# Patient Record
Sex: Male | Born: 1950 | Race: Black or African American | Hispanic: No | Marital: Single | Smoking: Never smoker
Health system: Southern US, Community
[De-identification: ages and names within clinical notes are randomized; demographics above are authoritative.]

## PROBLEM LIST (undated history)

## (undated) HISTORY — PX: EYE SURGERY: SHX253

## (undated) HISTORY — PX: OTHER SURGICAL HISTORY: SHX169

---

## 2013-02-17 ENCOUNTER — Emergency Department (HOSPITAL_COMMUNITY): Payer: Self-pay

## 2013-02-17 ENCOUNTER — Encounter (HOSPITAL_COMMUNITY): Payer: Self-pay | Admitting: Emergency Medicine

## 2013-02-17 ENCOUNTER — Emergency Department (HOSPITAL_COMMUNITY)
Admission: EM | Admit: 2013-02-17 | Discharge: 2013-02-17 | Disposition: A | Discharge status: Home / Self Care | Payer: Self-pay | Attending: Emergency Medicine | Admitting: Emergency Medicine

## 2013-02-17 DIAGNOSIS — S82831A Other fracture of upper and lower end of right fibula, initial encounter for closed fracture: Secondary | ICD-10-CM

## 2013-02-17 DIAGNOSIS — W108XXA Fall (on) (from) other stairs and steps, initial encounter: Secondary | ICD-10-CM | POA: Insufficient documentation

## 2013-02-17 DIAGNOSIS — S82899A Other fracture of unspecified lower leg, initial encounter for closed fracture: Secondary | ICD-10-CM | POA: Insufficient documentation

## 2013-02-17 DIAGNOSIS — Y92009 Unspecified place in unspecified non-institutional (private) residence as the place of occurrence of the external cause: Secondary | ICD-10-CM | POA: Insufficient documentation

## 2013-02-17 DIAGNOSIS — X500XXA Overexertion from strenuous movement or load, initial encounter: Secondary | ICD-10-CM | POA: Insufficient documentation

## 2013-02-17 DIAGNOSIS — Y9301 Activity, walking, marching and hiking: Secondary | ICD-10-CM | POA: Insufficient documentation

## 2013-02-17 MED ORDER — HYDROCODONE-ACETAMINOPHEN 5-325 MG PO TABS: 2.0000 | ORAL_TABLET | ORAL | Status: DC | PRN

## 2013-02-17 NOTE — Discharge Instructions (Signed)
Wear CAM walker as directed. You may remove this to sleep and shower. Use crutches and do not bear weight on your affected ankle. Take Vicodin as needed for pain. Follow up with Dr. Rennis ChrisSupple for further evaluation. Refer to attached documents for more information.

## 2013-02-17 NOTE — ED Notes (Signed)
Per PTAR: Pt was walking down stairs at his home and slipped down some stairs outside, which had ice on them. Pt reports right ankle pain and PTAR reports that the ankle has moderate swelling. Pt is A/O x4, denies LOC, or other injury.

## 2013-02-17 NOTE — ED Provider Notes (Signed)
CSN: [id number]     Arrival date & time 02/17/13  1109 History   First MD Initiated Contact with Patient 02/17/13 1114     Chief Complaint  Patient presents with  . Fall  . Ankle Injury   (Consider location/radiation/quality/duration/timing/severity/associated sxs/prior Treatment) HPI Comments: Patient is a 63 year old male who presents with right ankle pain that occurred prior to arrival when he slipped on icy steps and "twisted" his ankle. The mechanism of injury was sudden ankle inversion. Patient reports hearing a "pop" sudden onset of throbbing, moderate pain that is localized to right ankle. Patient reports progressive worsening of pain. Ankle movement and weight bearing activity make the pain worse. Nothing makes the pain better. Patient reports associated swelling. Patient has not tried anything for pain relief. Patient denies obvious deformity, numbness/tingling, coolness/weakness of extremity, bruising, and any other injury.      History reviewed. No pertinent past medical history. Past Surgical History  Procedure Laterality Date  . Eye surgery     No family history on file. History  Substance Use Topics  . Smoking status: Never Smoker   . Smokeless tobacco: Never Used  . Alcohol Use: Yes     Comment: Socially     Review of Systems  Constitutional: Negative for fever, chills and fatigue.  HENT: Negative for trouble swallowing.   Eyes: Negative for visual disturbance.  Respiratory: Negative for shortness of breath.   Cardiovascular: Negative for chest pain and palpitations.  Gastrointestinal: Negative for nausea, vomiting, abdominal pain and diarrhea.  Genitourinary: Negative for dysuria and difficulty urinating.  Musculoskeletal: Positive for arthralgias and joint swelling. Negative for neck pain.  Skin: Negative for color change.  Neurological: Negative for dizziness and weakness.  Psychiatric/Behavioral: Negative for dysphoric mood.    Allergies  Review of  patient's allergies indicates no known allergies.  Home Medications  No current outpatient prescriptions on file. BP 141/82  Pulse 61  Temp(Src) 99 F (37.2 C) (Oral)  Resp 20  SpO2 97% Physical Exam  Nursing note and vitals reviewed. Constitutional: He is oriented to person, place, and time. He appears well-developed and well-nourished. No distress.  HENT:  Head: Normocephalic and atraumatic.  Eyes: Conjunctivae are normal.  Neck: Normal range of motion.  Cardiovascular: Normal rate and regular rhythm.  Exam reveals no gallop and no friction rub.   No murmur heard. Pulmonary/Chest: Effort normal and breath sounds normal. He has no wheezes. He has no rales. He exhibits no tenderness.  Musculoskeletal:  Right ankle generalized edema and lateral tenderness to palpation. Limited ROM due to pain and edema. No obvious deformity.   Neurological: He is alert and oriented to person, place, and time. Coordination normal.  Sensation intact distal to injury. Speech is goal-oriented.   Skin: Skin is warm and dry.  Psychiatric: He has a normal mood and affect. His behavior is normal.    ED Course  Procedures (including critical care time) Labs Review Labs Reviewed - No data to display Imaging Review Dg Ankle Complete Right  02/17/2013   CLINICAL DATA:  Fall.  EXAM: RIGHT ANKLE - COMPLETE 3+ VIEW  COMPARISON:  None.  FINDINGS: Diffuse soft tissue swelling. An oblique displaced fracture is noted of the distal right fibular shaft. Subtle avulsion fracture fragments noted from the medial malleolus. Widening of the tibiotalar joint space present.  IMPRESSION: 1. Oblique displaced fracture noted of the distal right fibular shaft. 2. Subtle tiny avulsion fractures noted from the medial malleolus. Widening of the tibiotalar  joint space.   Electronically Signed   By: Maisie Fus  Register   On: 02/17/2013 11:47    EKG Interpretation   None       MDM   1. Fracture of fibula, distal, right, closed      12:08 PM Xray shows distal right fibula fracture. No neurovascular compromise. Patient will have CAM walker, crutches and recommended Orthopedic follow up. Patient will be non weight bearing. Vitals stable and patient afebrile. Patient declines pain medication at this time. Patient denies any other injury.     Emilia Beck, PA-C 02/17/13 1244

## 2013-02-17 NOTE — ED Provider Notes (Signed)
Medical screening examination/treatment/procedure(s) were conducted as a shared visit with non-physician practitioner(s) and myself.  I personally evaluated the patient during the encounter.  EKG Interpretation   None       nonweight-bearing right lower extremity.  Crutches and Cam Walker.  Given ankle fracture patient will likely need operative repair.  Orthopedic followup.  Lyanne CoKevin M Braxdon Gappa, MD 02/17/13 [phone]

## 2013-02-24 ENCOUNTER — Encounter (HOSPITAL_COMMUNITY): Payer: Self-pay | Admitting: Pharmacy Technician

## 2013-02-26 ENCOUNTER — Encounter (HOSPITAL_COMMUNITY): Payer: Self-pay | Admitting: *Deleted

## 2013-02-28 MED ORDER — CEFAZOLIN SODIUM-DEXTROSE 2-3 GM-% IV SOLR
2.0000 g | INTRAVENOUS | Status: AC
  Administered 2013-03-01: 2 g via INTRAVENOUS
  Filled 2013-02-28: qty 50

## 2013-02-28 MED ORDER — CHLORHEXIDINE GLUCONATE 4 % EX LIQD: 60.0000 mL | Freq: Once | CUTANEOUS | Status: DC

## 2013-02-28 MED ORDER — LACTATED RINGERS IV SOLN
INTRAVENOUS | Status: DC
  Administered 2013-03-01: 13:00:00 via INTRAVENOUS

## 2013-03-01 ENCOUNTER — Ambulatory Visit (HOSPITAL_COMMUNITY): Payer: Self-pay

## 2013-03-01 ENCOUNTER — Ambulatory Visit (HOSPITAL_COMMUNITY): Payer: Self-pay | Admitting: Anesthesiology

## 2013-03-01 ENCOUNTER — Ambulatory Visit (HOSPITAL_COMMUNITY)
Admission: RE | Admit: 2013-03-01 | Discharge: 2013-03-01 | Disposition: A | Discharge status: Home / Self Care | Payer: Self-pay | Source: Ambulatory Visit | Attending: Orthopedic Surgery | Admitting: Orthopedic Surgery

## 2013-03-01 ENCOUNTER — Encounter (HOSPITAL_COMMUNITY): Payer: Self-pay | Admitting: Anesthesiology

## 2013-03-01 ENCOUNTER — Encounter (HOSPITAL_COMMUNITY): Payer: Self-pay | Admitting: *Deleted

## 2013-03-01 ENCOUNTER — Encounter (HOSPITAL_COMMUNITY)
Admission: RE | Disposition: A | Discharge status: Home / Self Care | Payer: Self-pay | Source: Ambulatory Visit | Attending: Orthopedic Surgery

## 2013-03-01 DIAGNOSIS — X58XXXA Exposure to other specified factors, initial encounter: Secondary | ICD-10-CM | POA: Insufficient documentation

## 2013-03-01 DIAGNOSIS — S82899A Other fracture of unspecified lower leg, initial encounter for closed fracture: Secondary | ICD-10-CM | POA: Insufficient documentation

## 2013-03-01 HISTORY — PX: ORIF ANKLE FRACTURE: SHX5408

## 2013-03-01 LAB — COMPREHENSIVE METABOLIC PANEL
ALBUMIN: 3.5 g/dL (ref 3.5–5.2)
ALK PHOS: 69 U/L (ref 39–117)
ALT: 23 U/L (ref 0–53)
AST: 26 U/L (ref 0–37)
BUN: 11 mg/dL (ref 6–23)
CO2: 25 mEq/L (ref 19–32)
CREATININE: 1.11 mg/dL (ref 0.50–1.35)
Calcium: 9.1 mg/dL (ref 8.4–10.5)
Chloride: 102 mEq/L (ref 96–112)
GFR calc Af Amer: 80 mL/min — ABNORMAL LOW (ref >90)
GFR calc non Af Amer: 69 mL/min — ABNORMAL LOW (ref >90)
Glucose, Bld: 102 mg/dL — ABNORMAL HIGH (ref 70–99)
POTASSIUM: 4.1 meq/L (ref 3.7–5.3)
Sodium: 139 mEq/L (ref 137–147)
TOTAL PROTEIN: 7.1 g/dL (ref 6.0–8.3)
Total Bilirubin: 0.9 mg/dL (ref 0.3–1.2)

## 2013-03-01 LAB — CBC WITH DIFFERENTIAL/PLATELET
BASOS ABS: 0 10*3/uL (ref 0.0–0.1)
BASOS PCT: 0 % (ref 0–1)
EOS PCT: 1 % (ref 0–5)
Eosinophils Absolute: 0 10*3/uL (ref 0.0–0.7)
HCT: 41.4 % (ref 39.0–52.0)
Hemoglobin: 14.2 g/dL (ref 13.0–17.0)
Lymphocytes Relative: 39 % (ref 12–46)
Lymphs Abs: 1.7 10*3/uL (ref 0.7–4.0)
MCH: 29.7 pg (ref 26.0–34.0)
MCHC: 34.3 g/dL (ref 30.0–36.0)
MCV: 86.6 fL (ref 78.0–100.0)
Monocytes Absolute: 0.5 10*3/uL (ref 0.1–1.0)
Monocytes Relative: 12 % (ref 3–12)
Neutro Abs: 2.1 10*3/uL (ref 1.7–7.7)
Neutrophils Relative %: 48 % (ref 43–77)
PLATELETS: 268 10*3/uL (ref 150–400)
RBC: 4.78 MIL/uL (ref 4.22–5.81)
RDW: 13.5 % (ref 11.5–15.5)
WBC: 4.3 10*3/uL (ref 4.0–10.5)

## 2013-03-01 LAB — APTT: aPTT: 36 seconds (ref 24–37)

## 2013-03-01 LAB — PROTIME-INR
INR: 0.98 (ref 0.00–1.49)
Prothrombin Time: 12.8 seconds (ref 11.6–15.2)

## 2013-03-01 SURGERY — OPEN REDUCTION INTERNAL FIXATION (ORIF) ANKLE FRACTURE
Anesthesia: General | Site: Ankle | Laterality: Right

## 2013-03-01 MED ORDER — FENTANYL CITRATE 0.05 MG/ML IJ SOLN
INTRAMUSCULAR | Status: AC
  Filled 2013-03-01: qty 5

## 2013-03-01 MED ORDER — HYDROMORPHONE HCL PF 1 MG/ML IJ SOLN: 0.2500 mg | INTRAMUSCULAR | Status: DC | PRN

## 2013-03-01 MED ORDER — DEXAMETHASONE SODIUM PHOSPHATE 4 MG/ML IJ SOLN
INTRAMUSCULAR | Status: DC | PRN
  Administered 2013-03-01: 4 mg via INTRAVENOUS

## 2013-03-01 MED ORDER — LACTATED RINGERS IV SOLN
INTRAVENOUS | Status: DC | PRN
  Administered 2013-03-01 (×2): via INTRAVENOUS

## 2013-03-01 MED ORDER — FENTANYL CITRATE 0.05 MG/ML IJ SOLN
50.0000 ug | INTRAMUSCULAR | Status: DC | PRN
  Administered 2013-03-01: 100 ug via INTRAVENOUS

## 2013-03-01 MED ORDER — KETOROLAC TROMETHAMINE 30 MG/ML IJ SOLN: 15.0000 mg | Freq: Once | INTRAMUSCULAR | Status: DC | PRN

## 2013-03-01 MED ORDER — LIDOCAINE HCL (CARDIAC) 20 MG/ML IV SOLN
INTRAVENOUS | Status: AC
  Filled 2013-03-01: qty 5

## 2013-03-01 MED ORDER — MIDAZOLAM HCL 2 MG/2ML IJ SOLN
1.0000 mg | INTRAMUSCULAR | Status: DC | PRN
  Administered 2013-03-01: 2 mg via INTRAVENOUS

## 2013-03-01 MED ORDER — ONDANSETRON HCL 4 MG/2ML IJ SOLN
INTRAMUSCULAR | Status: DC | PRN
  Administered 2013-03-01: 4 mg via INTRAVENOUS

## 2013-03-01 MED ORDER — MIDAZOLAM HCL 2 MG/2ML IJ SOLN
INTRAMUSCULAR | Status: AC
  Filled 2013-03-01: qty 2

## 2013-03-01 MED ORDER — ARTIFICIAL TEARS OP OINT
TOPICAL_OINTMENT | OPHTHALMIC | Status: AC
  Filled 2013-03-01: qty 3.5

## 2013-03-01 MED ORDER — DEXAMETHASONE SODIUM PHOSPHATE 4 MG/ML IJ SOLN
INTRAMUSCULAR | Status: AC
  Filled 2013-03-01: qty 1

## 2013-03-01 MED ORDER — ONDANSETRON HCL 4 MG/2ML IJ SOLN: 4.0000 mg | Freq: Once | INTRAMUSCULAR | Status: DC | PRN

## 2013-03-01 MED ORDER — MIDAZOLAM HCL 5 MG/5ML IJ SOLN
INTRAMUSCULAR | Status: DC | PRN
  Administered 2013-03-01: 2 mg via INTRAVENOUS

## 2013-03-01 MED ORDER — FENTANYL CITRATE 0.05 MG/ML IJ SOLN
INTRAMUSCULAR | Status: AC
  Filled 2013-03-01: qty 2

## 2013-03-01 MED ORDER — ONDANSETRON HCL 4 MG/2ML IJ SOLN
INTRAMUSCULAR | Status: AC
  Filled 2013-03-01: qty 2

## 2013-03-01 MED ORDER — PROPOFOL 10 MG/ML IV BOLUS
INTRAVENOUS | Status: DC | PRN
  Administered 2013-03-01: 170 mg via INTRAVENOUS

## 2013-03-01 MED ORDER — DIAZEPAM 5 MG PO TABS: 2.5000 mg | ORAL_TABLET | Freq: Four times a day (QID) | ORAL | Status: AC | PRN

## 2013-03-01 MED ORDER — DOUBLE ANTIBIOTIC 500-10000 UNIT/GM EX OINT
TOPICAL_OINTMENT | CUTANEOUS | Status: AC
  Filled 2013-03-01: qty 1

## 2013-03-01 MED ORDER — LIDOCAINE HCL (CARDIAC) 10 MG/ML IV SOLN
INTRAVENOUS | Status: DC | PRN
  Administered 2013-03-01: 100 mg via INTRAVENOUS

## 2013-03-01 MED ORDER — OXYCODONE-ACETAMINOPHEN 5-325 MG PO TABS: 1.0000 | ORAL_TABLET | ORAL | Status: AC | PRN

## 2013-03-01 MED ORDER — 0.9 % SODIUM CHLORIDE (POUR BTL) OPTIME
TOPICAL | Status: DC | PRN
  Administered 2013-03-01: 1000 mL

## 2013-03-01 MED ORDER — FENTANYL CITRATE 0.05 MG/ML IJ SOLN
INTRAMUSCULAR | Status: DC | PRN
  Administered 2013-03-01: 50 ug via INTRAVENOUS

## 2013-03-01 MED ORDER — PROPOFOL 10 MG/ML IV BOLUS
INTRAVENOUS | Status: AC
  Filled 2013-03-01: qty 20

## 2013-03-01 SURGICAL SUPPLY — 77 items
BANDAGE ELASTIC 4 VELCRO ST LF (GAUZE/BANDAGES/DRESSINGS) ×2 IMPLANT
BANDAGE ELASTIC 6 VELCRO ST LF (GAUZE/BANDAGES/DRESSINGS) ×2 IMPLANT
BANDAGE ESMARK 6X9 LF (GAUZE/BANDAGES/DRESSINGS) ×1 IMPLANT
BIT DRILL 2.5X110 QC LCP DISP (BIT) ×2 IMPLANT
BIT DRILL PERC QC 2.8X200 100 (BIT) ×1 IMPLANT
BIT DRILL QC 3.5X110 (BIT) ×2 IMPLANT
BLADE SURG 10 STRL SS (BLADE) ×2 IMPLANT
BLADE SURG ROTATE 9660 (MISCELLANEOUS) IMPLANT
BNDG ESMARK 6X9 LF (GAUZE/BANDAGES/DRESSINGS) ×2
CLOTH BEACON ORANGE TIMEOUT ST (SAFETY) ×2 IMPLANT
CLSR STERI-STRIP ANTIMIC 1/2X4 (GAUZE/BANDAGES/DRESSINGS) ×2 IMPLANT
COVER MAYO STAND STRL (DRAPES) IMPLANT
COVER SURGICAL LIGHT HANDLE (MISCELLANEOUS) ×2 IMPLANT
CUFF TOURNIQUET SINGLE 18IN (TOURNIQUET CUFF) IMPLANT
CUFF TOURNIQUET SINGLE 24IN (TOURNIQUET CUFF) IMPLANT
CUFF TOURNIQUET SINGLE 34IN LL (TOURNIQUET CUFF) ×2 IMPLANT
CUFF TOURNIQUET SINGLE 44IN (TOURNIQUET CUFF) IMPLANT
DRAPE C-ARM 42X72 X-RAY (DRAPES) ×2 IMPLANT
DRAPE INCISE IOBAN 66X45 STRL (DRAPES) IMPLANT
DRAPE LAPAROTOMY T 102X78X121 (DRAPES) IMPLANT
DRAPE OEC MINIVIEW 54X84 (DRAPES) IMPLANT
DRAPE PROXIMA HALF (DRAPES) IMPLANT
DRAPE SURG 17X23 STRL (DRAPES) IMPLANT
DRAPE U-SHAPE 47X51 STRL (DRAPES) ×2 IMPLANT
DRILL BIT QUICK COUP 2.8MM 100 (BIT) ×1
DRSG ADAPTIC 3X8 NADH LF (GAUZE/BANDAGES/DRESSINGS) ×2 IMPLANT
DRSG PAD ABDOMINAL 8X10 ST (GAUZE/BANDAGES/DRESSINGS) IMPLANT
DURAPREP 26ML APPLICATOR (WOUND CARE) ×2 IMPLANT
ELECT REM PT RETURN 9FT ADLT (ELECTROSURGICAL) ×2
ELECTRODE REM PT RTRN 9FT ADLT (ELECTROSURGICAL) ×1 IMPLANT
FACESHIELD LNG OPTICON STERILE (SAFETY) IMPLANT
GLOVE BIO SURGEON STRL SZ7.5 (GLOVE) ×2 IMPLANT
GLOVE BIO SURGEON STRL SZ8 (GLOVE) ×2 IMPLANT
GLOVE EUDERMIC 7 POWDERFREE (GLOVE) ×2 IMPLANT
GLOVE SS BIOGEL STRL SZ 7.5 (GLOVE) ×1 IMPLANT
GLOVE SUPERSENSE BIOGEL SZ 7.5 (GLOVE) ×1
GOWN STRL NON-REIN LRG LVL3 (GOWN DISPOSABLE) ×2 IMPLANT
GOWN STRL REIN XL XLG (GOWN DISPOSABLE) ×4 IMPLANT
KIT BASIN OR (CUSTOM PROCEDURE TRAY) ×2 IMPLANT
KIT ROOM TURNOVER OR (KITS) ×2 IMPLANT
MANIFOLD NEPTUNE II (INSTRUMENTS) IMPLANT
NEEDLE 22X1 1/2 (OR ONLY) (NEEDLE) IMPLANT
NS IRRIG 1000ML POUR BTL (IV SOLUTION) ×2 IMPLANT
PACK ORTHO EXTREMITY (CUSTOM PROCEDURE TRAY) ×2 IMPLANT
PAD ABD 8X10 STRL (GAUZE/BANDAGES/DRESSINGS) ×6 IMPLANT
PAD ARMBOARD 7.5X6 YLW CONV (MISCELLANEOUS) ×2 IMPLANT
PAD CAST 4YDX4 CTTN HI CHSV (CAST SUPPLIES) ×1 IMPLANT
PADDING CAST COTTON 4X4 STRL (CAST SUPPLIES) ×1
PLATE LCP 3.5 1/3 TUB 8HX93 (Plate) ×2 IMPLANT
SCREW CANC FT ST SFS 4X16 (Screw) ×2 IMPLANT
SCREW CORTEX 3.5 10MM (Screw) ×1 IMPLANT
SCREW CORTEX 3.5 14MM (Screw) ×2 IMPLANT
SCREW CORTEX 3.5 16MM (Screw) ×2 IMPLANT
SCREW CORTEX 3.5 18MM (Screw) ×1 IMPLANT
SCREW LOCK CORT ST 3.5X10 (Screw) ×1 IMPLANT
SCREW LOCK CORT ST 3.5X14 (Screw) ×2 IMPLANT
SCREW LOCK CORT ST 3.5X16 (Screw) ×2 IMPLANT
SCREW LOCK CORT ST 3.5X18 (Screw) ×1 IMPLANT
SCREW LOCK T15 FT 14X3.5X2.9X (Screw) ×1 IMPLANT
SCREW LOCKING 3.5X14 (Screw) ×1 IMPLANT
SPLINT PLASTER CAST XFAST 4X15 (CAST SUPPLIES) ×1 IMPLANT
SPLINT PLASTER XTRA FAST SET 4 (CAST SUPPLIES) ×1
SPONGE GAUZE 4X4 12PLY (GAUZE/BANDAGES/DRESSINGS) ×2 IMPLANT
SPONGE LAP 4X18 X RAY DECT (DISPOSABLE) ×2 IMPLANT
STAPLER VISISTAT 35W (STAPLE) IMPLANT
STRIP CLOSURE SKIN 1/2X4 (GAUZE/BANDAGES/DRESSINGS) ×2 IMPLANT
SUCTION FRAZIER TIP 10 FR DISP (SUCTIONS) ×2 IMPLANT
SUT MNCRL AB 3-0 PS2 18 (SUTURE) ×4 IMPLANT
SUT VIC AB 0 CT1 27 (SUTURE) ×1
SUT VIC AB 0 CT1 27XBRD ANBCTR (SUTURE) ×1 IMPLANT
SUT VIC AB 2-0 CT1 27 (SUTURE) ×3
SUT VIC AB 2-0 CT1 TAPERPNT 27 (SUTURE) ×3 IMPLANT
SYR CONTROL 10ML LL (SYRINGE) IMPLANT
TOWEL OR 17X24 6PK STRL BLUE (TOWEL DISPOSABLE) ×2 IMPLANT
TOWEL OR 17X26 10 PK STRL BLUE (TOWEL DISPOSABLE) ×2 IMPLANT
TUBE CONNECTING 12X1/4 (SUCTIONS) ×2 IMPLANT
WATER STERILE IRR 1000ML POUR (IV SOLUTION) IMPLANT

## 2013-03-01 NOTE — Preoperative (Signed)
Beta Blockers   Reason not to administer Beta Blockers:Not Applicable 

## 2013-03-01 NOTE — Progress Notes (Signed)
CRNA at bedside.

## 2013-03-01 NOTE — H&P (Signed)
Julio Almerry Ostergaard    Chief Complaint: RIGHT DISTAL FIBULAR FRACTURE HPI: The patient is a 63 y.o. male with a displaced right distal fibular fracture  History reviewed. No pertinent past medical history.  Past Surgical History  Procedure Laterality Date  . Thumb surgery      as a child  . Eye surgery      as a child    History reviewed. No pertinent family history.  Social History:  reports that he has never smoked. He has never used smokeless tobacco. He reports that he drinks alcohol. He reports that he does not use illicit drugs.  Allergies: No Known Allergies  No prescriptions prior to admission     Physical Exam: right ankle with swelling and lateral tenderness, skin intact  Vitals  Temp:  [98.3 F (36.8 C)] 98.3 F (36.8 C) (01/26 1257) Pulse Rate:  [60-82] 77 (01/26 1415) Resp:  [8-26] 14 (01/26 1415) BP: (115-150)/(54-93) 120/54 mmHg (01/26 1415) SpO2:  [98 %-100 %] 100 % (01/26 1415) Weight:  [88.451 kg (195 lb)] 88.451 kg (195 lb) (01/26 1257)  Assessment/Plan  Impression: RIGHT DISTAL FIBULAR FRACTURE  Plan of Action: Procedure(s): OPEN REDUCTION INTERNAL FIXATION (ORIF) RIGHT  ANKLE FRACTURE  Roniqua Kintz M 03/01/2013, 2:45 PM

## 2013-03-01 NOTE — Op Note (Signed)
03/01/2013  3:48 PM  PATIENT:   Shane Chang  63 y.o. male  PRE-OPERATIVE DIAGNOSIS:  RIGHT DISTAL FIBULAR FRACTURE  POST-OPERATIVE DIAGNOSIS:  same  PROCEDURE:  ORIF  SURGEON:  Nayra Coury, Vania ReaKevin M. M.D.  ASSISTANTS: Shuford pac   ANESTHESIA:   GET + nerve block  EBL: min  SPECIMEN:  none  Drains: none  TT <1hr   PATIENT DISPOSITION:  PACU - hemodynamically stable.    PLAN OF CARE: Discharge to home after PACU  Dictation# [id number]

## 2013-03-01 NOTE — Anesthesia Procedure Notes (Addendum)
Anesthesia Regional Block:   Narrative:    Anesthesia Regional Block:  Popliteal block  Pre-Anesthetic Checklist: ,, timeout performed, Correct Patient, Correct Site, Correct Laterality, Correct Procedure, Correct Position, site marked, Risks and benefits discussed,  Surgical consent,  Pre-op evaluation,  At surgeon's request and post-op pain management  Laterality: Right  Prep: chloraprep       Needles:  Injection technique: Single-shot  Needle Type: Echogenic Stimulator Needle     Needle Length:cm 9 cm Needle Gauge: 22 and 22 G    Additional Needles:  Procedures: ultrasound guided (picture in chart) Popliteal block Narrative:  Start time: 03/01/2013 2:10 PM End time: 03/01/2013 2:15 PM Injection made incrementally with aspirations every 5 mL.  Performed by: Personally   Additional Notes: 30 cc 0.5% Bupivacaine with 1:200 Epi and 8 mg decadron

## 2013-03-01 NOTE — Transfer of Care (Signed)
Immediate Anesthesia Transfer of Care Note  Patient: Shane Chang  Procedure(s) Performed: Procedure(s): OPEN REDUCTION INTERNAL FIXATION (ORIF) RIGHT  ANKLE FRACTURE (Right)  Patient Location: PACU  Anesthesia Type:General  Level of Consciousness: awake, alert , oriented and patient cooperative  Airway & Oxygen Therapy: Patient Spontanous Breathing and Patient connected to nasal cannula oxygen  Post-op Assessment: Report given to PACU RN, Post -op Vital signs reviewed and stable and Patient moving all extremities X 4  Post vital signs: Reviewed and stable  Complications: No apparent anesthesia complications

## 2013-03-01 NOTE — Discharge Instructions (Signed)
Keep current splint clean and dry. Use a bag for showering. No weight on right leg, crutches for ambulation. Bring your CAM walker (boot) to your first post operative appointment with you   What to eat:  For your first meals, you should eat lightly; only small meals initially.  If you do not have nausea, you may eat larger meals.  Avoid spicy, greasy and heavy food.    General Anesthesia, Adult, Care After  Refer to this sheet in the next few weeks. These instructions provide you with information on caring for yourself after your procedure. Your health care provider may also give you more specific instructions. Your treatment has been planned according to current medical practices, but problems sometimes occur. Call your health care provider if you have any problems or questions after your procedure.  WHAT TO EXPECT AFTER THE PROCEDURE  After the procedure, it is typical to experience:  Sleepiness.  Nausea and vomiting. HOME CARE INSTRUCTIONS  For the first 24 hours after general anesthesia:  Have a responsible person with you.  Do not drive a car. If you are alone, do not take public transportation.  Do not drink alcohol.  Do not take medicine that has not been prescribed by your health care provider.  Do not sign important papers or make important decisions.  You may resume a normal diet and activities as directed by your health care provider.  Change bandages (dressings) as directed.  If you have questions or problems that seem related to general anesthesia, call the hospital and ask for the anesthetist or anesthesiologist on call. SEEK MEDICAL CARE IF:  You have nausea and vomiting that continue the day after anesthesia.  You develop a rash. SEEK IMMEDIATE MEDICAL CARE IF:  You have difficulty breathing.  You have chest pain.  You have any allergic problems. Document Released: 04/29/2000 Document Revised: 09/23/2012 Document Reviewed: 08/06/2012  [facility] Patient Information  2014 [employer], [place].

## 2013-03-01 NOTE — Anesthesia Postprocedure Evaluation (Signed)
  Anesthesia Post-op Note  Patient: Shane Chang  Procedure(s) Performed: Procedure(s): OPEN REDUCTION INTERNAL FIXATION (ORIF) RIGHT  ANKLE FRACTURE (Right)  Patient Location: PACU  Anesthesia Type:GA combined with regional for post-op pain  Level of Consciousness: awake, alert  and oriented  Airway and Oxygen Therapy: Patient Spontanous Breathing and Patient connected to nasal cannula oxygen  Post-op Pain: None  Post-op Assessment: Post-op Vital signs reviewed, Patient's Cardiovascular Status Stable, Respiratory Function Stable, Patent Airway, No signs of Nausea or vomiting and Pain level controlled  Post-op Vital Signs: stable  Complications: No apparent anesthesia complications

## 2013-03-01 NOTE — Addendum Note (Signed)
Addendum created 03/01/13 1940 by Kipp Broodavid Saqib Cazarez, MD   Modules edited: Anesthesia Blocks and Procedures

## 2013-03-01 NOTE — Anesthesia Preprocedure Evaluation (Signed)
Anesthesia Evaluation  Patient identified by MRN, date of birth, ID band Patient awake    Reviewed: Allergy & Precautions, H&P , NPO status , Patient's Chart, lab work & pertinent test results  Airway Mallampati: II TM Distance: >3 FB Neck ROM: Full    Dental  (+) Teeth Intact and Dental Advisory Given   Pulmonary  breath sounds clear to auscultation        Cardiovascular Rhythm:Regular Rate:Normal     Neuro/Psych    GI/Hepatic   Endo/Other    Renal/GU      Musculoskeletal   Abdominal   Peds  Hematology   Anesthesia Other Findings   Reproductive/Obstetrics                           Anesthesia Physical Anesthesia Plan  ASA: I  Anesthesia Plan: General   Post-op Pain Management:    Induction: Intravenous  Airway Management Planned: LMA  Additional Equipment:   Intra-op Plan:   Post-operative Plan:   Informed Consent: I have reviewed the patients History and Physical, chart, labs and discussed the procedure including the risks, benefits and alternatives for the proposed anesthesia with the patient or authorized representative who has indicated his/her understanding and acceptance.   Dental advisory given  Plan Discussed with: CRNA and Anesthesiologist  Anesthesia Plan Comments: (R. Ankle Fracture  Plan GA with popliteal block  )        Anesthesia Quick Evaluation

## 2013-03-02 ENCOUNTER — Encounter (HOSPITAL_COMMUNITY): Payer: Self-pay | Admitting: Orthopedic Surgery

## 2013-03-03 NOTE — Op Note (Signed)
NAMRacheal Patches:  Chang, Shane Chang                 ACCOUNT NO.:  [id number]  MEDICAL RECORD NO.:  [id number]  LOCATION:                               FACILITY:  [facility]  PHYSICIAN:  Vania ReaKevin M. Devyn Sheerin, M.D.  DATE OF BIRTH:  12-16-1950  DATE OF PROCEDURE:  03/01/2013 DATE OF DISCHARGE:  03/01/2013                              OPERATIVE REPORT   PREOPERATIVE DIAGNOSIS:  Displaced right distal fibular fracture.  POSTOPERATIVE DIAGNOSIS:  Displaced right distal fibular fracture.  PROCEDURE:  Open reduction and internal fixation of the displaced right distal fibular fracture.  SURGEON:  Vania ReaKevin M. Desiree Fleming, M.D.  Threasa HeadsASSISTANFrench Ana:  Tracy A. Shuford, PA-C.  ANESTHESIA:  General endotracheal as well as a peroneal nerve block.  TOURNIQUET TIME:  Less than 1 hour.  ESTIMATED BLOOD LOSS:  Minimal.  DRAINS:  None.  HISTORY:  Shane Chang is a 63 year old gentleman who fell sustaining a displaced right distal fibular fracture almost 2 weeks ago.  He was seen in the local ER, with instructions to follow up in our office.  At that time in our office, he was found to have diffuse swelling about the ankle tenderness laterally and plain radiographs did show evidence for a displaced distal fibular fracture with slight widening of the medial clear space.  He was subsequently brought to the operating room at this time for planned open reduction and internal fixation.  Preoperatively, I counseled Shane Chang on treatment options as well as risks versus benefits thereof.  Possible surgical complications were reviewed including potential for bleeding, infection, neurovascular injury, malunion, nonunion, loss of fixation, and possible need for additional surgery.  He understands and accepts and agrees with our planned procedure.  PROCEDURE IN DETAIL:  After undergoing routine preop evaluation, the patient did receive prophylactic antibiotics, placed supine on the operative table, underwent a smooth induction of a general  endotracheal anesthesia.  Turned by the right thigh and right leg was sterilely prepped and draped in the standard fashion.  A time-out was called.  Leg was exsanguinated with a tourniquet inflated to 350 mmHg.  A longitudinal incision made over the lateral aspect of the ankle overlying the distal fibula in length of the incision approximately 10 cm.  Skin flaps were elevated and dissection carried deeply down to the lateral fibular shaft with a peroneal musculature and tendon being reflected posteriorly.  Subperiosteal dissection was then used to remove the soft tissue from the lateral and posterolateral margin of the distal fibula.  There was abundant hematoma and clot and early callus that had developed about the fracture site, and this was all carefully removed with a rongeur and pickups.  We then irrigated the fracture site removing all interposed soft tissue and then under direct visualization, we were able to reduce the fracture with good realignment.  Once we confirmed an appropriate reduction, we then selected the 8 hole 1/3rd tubular plate and contoured this to fit over the lateral margin of the distal fibula.  Once the plate was contoured and applied and held temporarily with a bone holding clamp, it was transfixed proximally and once I had achieved proper alignment, then I did place a distal 4-0 cancellous screw,  and then was able to place a lag screw across through the plate traversing the fracture site, and obtaining excellent compression of the fracture site with the lag screw that was placed through the plate.  I then completed the fixation proximally and distally, and I had placed an additional locking screw distally at the tip of the plate and filled the proximal holes of the plate with 3.5 cortical screws and a unicortical screw most proximally in the plate. The overall construct was much to our satisfaction with fluoroscopic images subsequently obtained which showed  anatomic alignment at the fracture site, good position of the hardware.  At this point, the wound was copiously irrigated.  The deep fascial plane was closed with a series of interrupted #1 Vicryl sutures, 2-0 Vicryl used for the subcu layer and intracuticular 3-0 Monocryl for the skin followed by Steri- Strips.  Dry dressing was applied.  Well-padded short-leg stirrup splint was then placed with the ankle in neutral position.  The patient was then awakened, extubated, and taken to the recovery room in stable condition.     Vania Rea. Felesia Stahlecker, M.D.     KMS/MEDQ  D:  03/01/2013  T:  03/02/2013  Job:  [id number]

## 2013-04-21 ENCOUNTER — Ambulatory Visit: Payer: Self-pay | Attending: Orthopedic Surgery | Admitting: Physical Therapy

## 2013-04-21 DIAGNOSIS — R262 Difficulty in walking, not elsewhere classified: Secondary | ICD-10-CM | POA: Insufficient documentation

## 2013-04-21 DIAGNOSIS — R609 Edema, unspecified: Secondary | ICD-10-CM | POA: Insufficient documentation

## 2013-04-21 DIAGNOSIS — IMO0001 Reserved for inherently not codable concepts without codable children: Secondary | ICD-10-CM | POA: Insufficient documentation

## 2013-04-21 DIAGNOSIS — M25579 Pain in unspecified ankle and joints of unspecified foot: Secondary | ICD-10-CM | POA: Insufficient documentation

## 2013-04-21 DIAGNOSIS — M25673 Stiffness of unspecified ankle, not elsewhere classified: Secondary | ICD-10-CM | POA: Insufficient documentation

## 2013-04-21 DIAGNOSIS — M25676 Stiffness of unspecified foot, not elsewhere classified: Secondary | ICD-10-CM | POA: Insufficient documentation

## 2013-04-26 ENCOUNTER — Ambulatory Visit: Payer: Self-pay | Admitting: Physical Therapy

## 2013-04-30 ENCOUNTER — Ambulatory Visit: Payer: Self-pay | Admitting: Physical Therapy

## 2013-05-03 ENCOUNTER — Ambulatory Visit: Payer: Self-pay | Admitting: Physical Therapy

## 2013-05-10 ENCOUNTER — Ambulatory Visit: Payer: Self-pay | Admitting: Physical Therapy

## 2013-05-14 ENCOUNTER — Ambulatory Visit: Payer: Self-pay | Admitting: Physical Therapy

## 2013-05-18 ENCOUNTER — Ambulatory Visit: Payer: Self-pay | Attending: Orthopedic Surgery | Admitting: Physical Therapy

## 2013-05-18 DIAGNOSIS — IMO0001 Reserved for inherently not codable concepts without codable children: Secondary | ICD-10-CM | POA: Insufficient documentation

## 2013-05-18 DIAGNOSIS — R609 Edema, unspecified: Secondary | ICD-10-CM | POA: Insufficient documentation

## 2013-05-18 DIAGNOSIS — R262 Difficulty in walking, not elsewhere classified: Secondary | ICD-10-CM | POA: Insufficient documentation

## 2013-05-18 DIAGNOSIS — M25673 Stiffness of unspecified ankle, not elsewhere classified: Secondary | ICD-10-CM | POA: Insufficient documentation

## 2013-05-18 DIAGNOSIS — M25676 Stiffness of unspecified foot, not elsewhere classified: Secondary | ICD-10-CM | POA: Insufficient documentation

## 2013-05-18 DIAGNOSIS — M25579 Pain in unspecified ankle and joints of unspecified foot: Secondary | ICD-10-CM | POA: Insufficient documentation

## 2013-05-21 ENCOUNTER — Ambulatory Visit: Payer: Self-pay | Admitting: Physical Therapy

## 2013-05-24 ENCOUNTER — Ambulatory Visit: Payer: Self-pay | Admitting: Physical Therapy

## 2013-05-28 ENCOUNTER — Ambulatory Visit: Payer: Self-pay | Admitting: Physical Therapy

## 2013-06-01 ENCOUNTER — Ambulatory Visit: Payer: Self-pay | Admitting: Physical Therapy

## 2013-06-04 ENCOUNTER — Ambulatory Visit: Payer: Self-pay | Admitting: Physical Therapy

## 2013-06-17 ENCOUNTER — Ambulatory Visit: Payer: Self-pay | Attending: Orthopedic Surgery | Admitting: Physical Therapy

## 2013-06-17 DIAGNOSIS — R609 Edema, unspecified: Secondary | ICD-10-CM | POA: Insufficient documentation

## 2013-06-17 DIAGNOSIS — IMO0001 Reserved for inherently not codable concepts without codable children: Secondary | ICD-10-CM | POA: Insufficient documentation

## 2013-06-17 DIAGNOSIS — M25579 Pain in unspecified ankle and joints of unspecified foot: Secondary | ICD-10-CM | POA: Insufficient documentation

## 2013-06-17 DIAGNOSIS — M25673 Stiffness of unspecified ankle, not elsewhere classified: Secondary | ICD-10-CM | POA: Insufficient documentation

## 2013-06-17 DIAGNOSIS — R262 Difficulty in walking, not elsewhere classified: Secondary | ICD-10-CM | POA: Insufficient documentation

## 2013-06-17 DIAGNOSIS — M25676 Stiffness of unspecified foot, not elsewhere classified: Secondary | ICD-10-CM | POA: Insufficient documentation

## 2014-03-14 IMAGING — RF DG C-ARM 61-120 MIN
1 series · 2 of 2 positions shown · non-contrast
Comparison: 02/17/2013

CLINICAL DATA: Fibular fracture

EXAM:
DG C-ARM 1-60 MIN; RIGHT ANKLE - 2 VIEW

[Series 1: run · 2 of 2 slices shown]
[im 1/2]
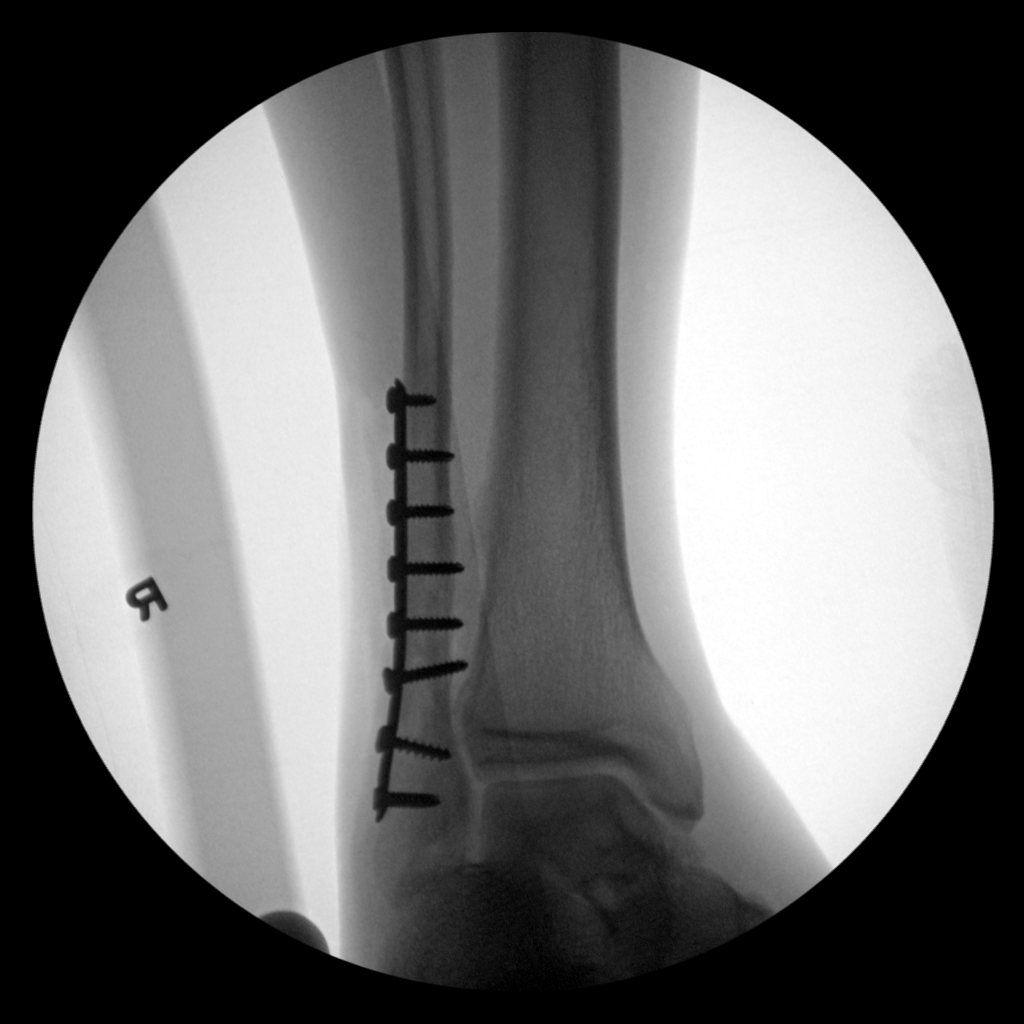
[im 2/2]
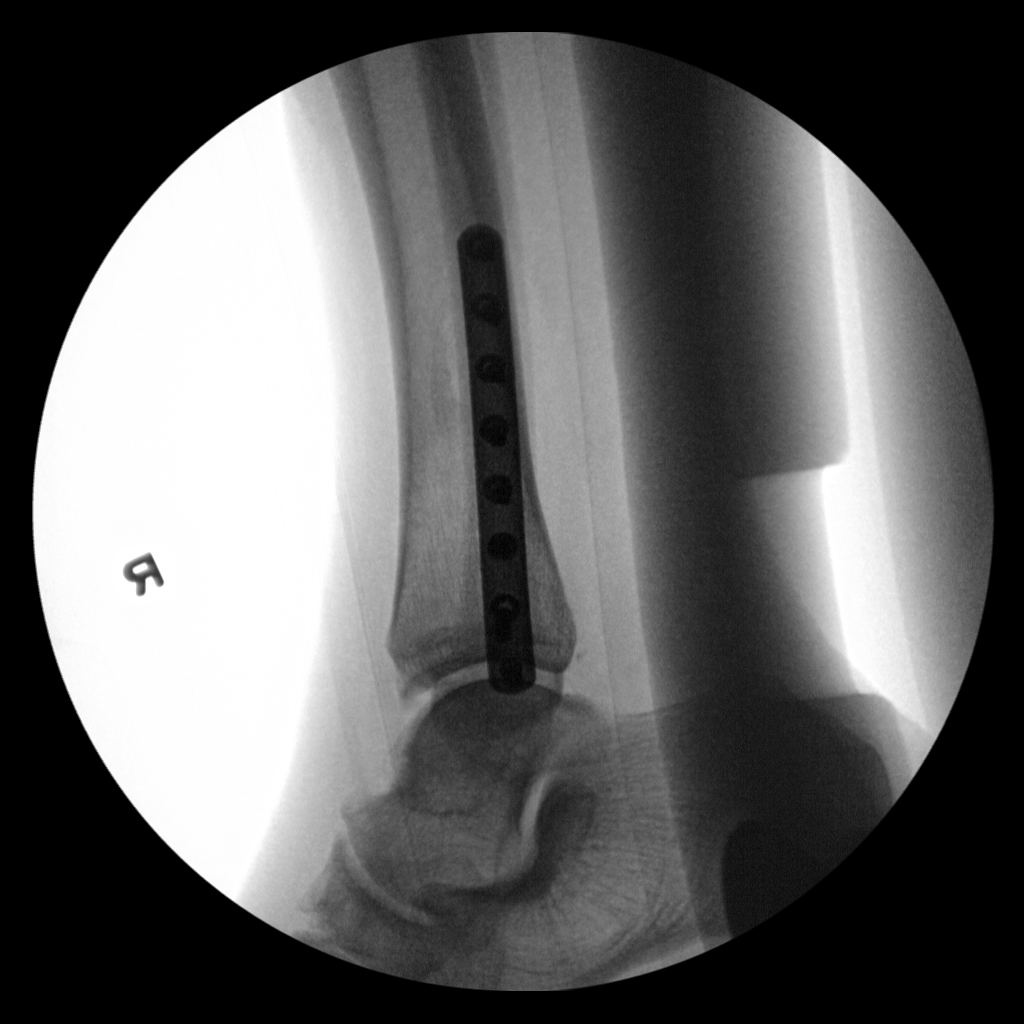

[2 of 2 positions shown; findings below may reference images not displayed]

FINDINGS: Spot fluoroscopic intraoperative views demonstrate lateral plate and
screw fixation reducing the right distal fibula fracture. Anatomic
alignment. No hardware osseous abnormality.
IMPRESSION: Status post ORIF for the right distal fibula fracture. No
complicating feature. Anatomic alignment.

## 2016-10-16 ENCOUNTER — Encounter: Payer: Self-pay | Admitting: Gastroenterology

## 2016-12-09 ENCOUNTER — Encounter: Payer: Self-pay | Admitting: Gastroenterology

## 2019-07-29 ENCOUNTER — Emergency Department (HOSPITAL_COMMUNITY): Payer: Medicare HMO

## 2019-07-29 ENCOUNTER — Emergency Department (HOSPITAL_COMMUNITY)
Admission: EM | Admit: 2019-07-29 | Discharge: 2019-07-29 | Disposition: A | Discharge status: Home / Self Care | Payer: Medicare HMO | Attending: Emergency Medicine | Admitting: Emergency Medicine

## 2019-07-29 ENCOUNTER — Encounter (HOSPITAL_COMMUNITY): Payer: Self-pay | Admitting: Emergency Medicine

## 2019-07-29 ENCOUNTER — Other Ambulatory Visit: Payer: Self-pay

## 2019-07-29 DIAGNOSIS — Z791 Long term (current) use of non-steroidal anti-inflammatories (NSAID): Secondary | ICD-10-CM | POA: Insufficient documentation

## 2019-07-29 DIAGNOSIS — R0789 Other chest pain: Secondary | ICD-10-CM | POA: Diagnosis not present

## 2019-07-29 DIAGNOSIS — F1099 Alcohol use, unspecified with unspecified alcohol-induced disorder: Secondary | ICD-10-CM | POA: Diagnosis not present

## 2019-07-29 LAB — CBC
HCT: 44.1 % (ref 39.0–52.0)
Hemoglobin: 14.6 g/dL (ref 13.0–17.0)
MCH: 29.3 pg (ref 26.0–34.0)
MCHC: 33.1 g/dL (ref 30.0–36.0)
MCV: 88.4 fL (ref 80.0–100.0)
Platelets: 189 10*3/uL (ref 150–400)
RBC: 4.99 MIL/uL (ref 4.22–5.81)
RDW: 14.1 % (ref 11.5–15.5)
WBC: 4.3 10*3/uL (ref 4.0–10.5)
nRBC: 0 % (ref 0.0–0.2)

## 2019-07-29 LAB — BASIC METABOLIC PANEL
Anion gap: 10 (ref 5–15)
BUN: 10 mg/dL (ref 8–23)
CO2: 28 mmol/L (ref 22–32)
Calcium: 9.8 mg/dL (ref 8.9–10.3)
Chloride: 103 mmol/L (ref 98–111)
Creatinine, Ser: 1.37 mg/dL — ABNORMAL HIGH (ref 0.61–1.24)
GFR calc Af Amer: >60 mL/min (ref >60)
GFR calc non Af Amer: 52 mL/min — ABNORMAL LOW (ref >60)
Glucose, Bld: 107 mg/dL — ABNORMAL HIGH (ref 70–99)
Potassium: 4 mmol/L (ref 3.5–5.1)
Sodium: 141 mmol/L (ref 135–145)

## 2019-07-29 LAB — TROPONIN I (HIGH SENSITIVITY): Troponin I (High Sensitivity): 3 ng/L (ref <18)

## 2019-07-29 NOTE — ED Triage Notes (Signed)
Per pt, states he has increased his exercise regimen-states he was drinking a lot of caffeine but quick due to chest discomfort-states he is having a chest tightness but thinks it is due to his work out

## 2019-07-29 NOTE — ED Provider Notes (Signed)
[facility] DEPT Provider Note   CSN: [id number] Arrival date & time: 07/29/19  1547     History Chief Complaint  Patient presents with  . Chest Pain    Shane Chang is a 69 y.o. male otherwise healthy who presented with chest pain.  Patient states that he got his Covid vaccine back in March.  He states that since then he has been exercising more and may have strained his chest muscles.  He has intermittent chest pressure going on for the last several weeks.  He states that after he walks he actually gets better.  He denies pain worsening over the last day or 2.  Patient denies any shortness of breath or fevers or cough.  Patient denies any heart history or recent travels.  Patient also has been drinking caffeine more recently and then stopped and his chest pressure actually got better.  He just wants to get checked out to make sure everything is okay.  No family history of CAD.  The history is provided by the patient.       History reviewed. No pertinent past medical history.  There are no problems to display for this patient.   Past Surgical History:  Procedure Laterality Date  . EYE SURGERY     as a child  . ORIF ANKLE FRACTURE Right 03/01/2013   Procedure: OPEN REDUCTION INTERNAL FIXATION (ORIF) RIGHT  ANKLE FRACTURE;  Surgeon: Marin Shutter, MD;  Location: [facility];  Service: Orthopedics;  Laterality: Right;  . thumb surgery     as a child       No family history on file.  Social History   Tobacco Use  . Smoking status: Never Smoker  . Smokeless tobacco: Never Used  Substance Use Topics  . Alcohol use: Yes    Comment: Socially- glass of wine or beer once every 2 months  . Drug use: No    Home Medications Prior to Admission medications   Medication Sig Start Date End Date Taking? Authorizing Provider  diazepam (VALIUM) 5 MG tablet Take 0.5-1 tablets (2.5-5 mg total) by mouth every 6 (six) hours as needed for muscle spasms or  sedation. 03/01/13   Shuford, Olivia Mackie, PA-C  oxyCODONE-acetaminophen (PERCOCET) 5-325 MG per tablet Take 1-2 tablets by mouth every 4 (four) hours as needed. 03/01/13   Shuford, Olivia Mackie, PA-C    Allergies    Patient has no known allergies.  Review of Systems   Review of Systems  Cardiovascular: Positive for chest pain.  All other systems reviewed and are negative.   Physical Exam Updated Vital Signs BP (!) 147/82 (BP Location: Left Arm)   Pulse (!) 59   Temp 98 F (36.7 C) (Oral)   Resp 18   SpO2 99%   Physical Exam Vitals and nursing note reviewed.  Constitutional:      Appearance: He is well-developed.  HENT:     Head: Normocephalic.  Eyes:     Extraocular Movements: Extraocular movements intact.     Pupils: Pupils are equal, round, and reactive to light.  Cardiovascular:     Rate and Rhythm: Normal rate and regular rhythm.     Heart sounds: Normal heart sounds.  Pulmonary:     Effort: Pulmonary effort is normal.     Breath sounds: Normal breath sounds.  Chest:     Comments: No reproducible tenderness  Abdominal:     General: Bowel sounds are normal.     Palpations: Abdomen is soft.  Musculoskeletal:        General: Normal range of motion.     Cervical back: Normal range of motion and neck supple.  Skin:    General: Skin is warm.     Capillary Refill: Capillary refill takes less than 2 seconds.  Neurological:     General: No focal deficit present.     Mental Status: He is alert and oriented to person, place, and time.  Psychiatric:        Mood and Affect: Mood normal.        Behavior: Behavior normal.     ED Results / Procedures / Treatments   Labs (all labs ordered are listed, but only abnormal results are displayed) Labs Reviewed  BASIC METABOLIC PANEL - Abnormal; Notable for the following components:      Result Value   Glucose, Bld 107 (*)    Creatinine, Ser 1.37 (*)    GFR calc non Af Amer 52 (*)    All other components within normal limits  CBC    TROPONIN I (HIGH SENSITIVITY)  TROPONIN I (HIGH SENSITIVITY)    EKG EKG Interpretation  Date/Time:  Thursday July 29 2019 16:00:16 EDT Ventricular Rate:  73 PR Interval:    QRS Duration: 115 QT Interval:  406 QTC Calculation: 448 R Axis:   -12 Text Interpretation: Sinus rhythm Left atrial enlargement Incomplete RBBB and LAFB 12 Lead; [facility] No previous ECGs available Confirmed by Richardean Canal [phone]) on 07/29/2019 8:05:53 PM   Radiology DG Chest 2 View  Result Date: 07/29/2019 CLINICAL DATA:  Chest pain.  Chest tightness EXAM: CHEST - 2 VIEW COMPARISON:  None FINDINGS: Cardiomediastinal contours and hilar structures are normal. Lungs are clear. No sign of pleural effusion. On limited assessment skeletal structures are unremarkable. IMPRESSION: No acute cardiopulmonary disease. Electronically Signed   By: Donzetta Kohut M.D.   On: 07/29/2019 17:18    Procedures Procedures (including critical care time)  Medications Ordered in ED Medications - No data to display  ED Course  I have reviewed the triage vital signs and the nursing notes.  Pertinent labs & imaging results that were available during my care of the patient were reviewed by me and considered in my medical decision making (see chart for details).    MDM Rules/Calculators/A&P                          Shane Chang is a 69 y.o. male here presenting with chest pressure that is going on for last several weeks and not worsening over the last day or 2.  I have very low suspicion for CAD or dissection or PE.  Since symptoms have been going on for weeks and he has no known CAD, 1 set of troponin is sufficient.  We will also get CBC, BMP and chest x-ray.  8:24 PM Labs including troponin unremarkable.  Chest x-ray unremarkable.  Stable for discharge.  If he has persistent pain recommend cardiology follow up   Final Clinical Impression(s) / ED Diagnoses Final diagnoses:  None    Rx / DC Orders ED Discharge Orders     None       Charlynne Pander, MD 07/29/19 2024

## 2019-07-29 NOTE — Discharge Instructions (Addendum)
Take tylenol for pain   See your doctor   Your blood pressure is slightly elevated. Recheck at home and with your doctor   See cardiology if you have persistent chest pain   Return to ER if you have worse chest pain, trouble breathing

## 2019-07-29 NOTE — ED Notes (Signed)
ED Provider at bedside. 

## 2020-08-10 IMAGING — CR DG CHEST 2V
2 series · 2 of 2 positions shown · non-contrast
Comparison: None

CLINICAL DATA: Chest pain.  Chest tightness

EXAM:
CHEST - 2 VIEW

[w chest pa]
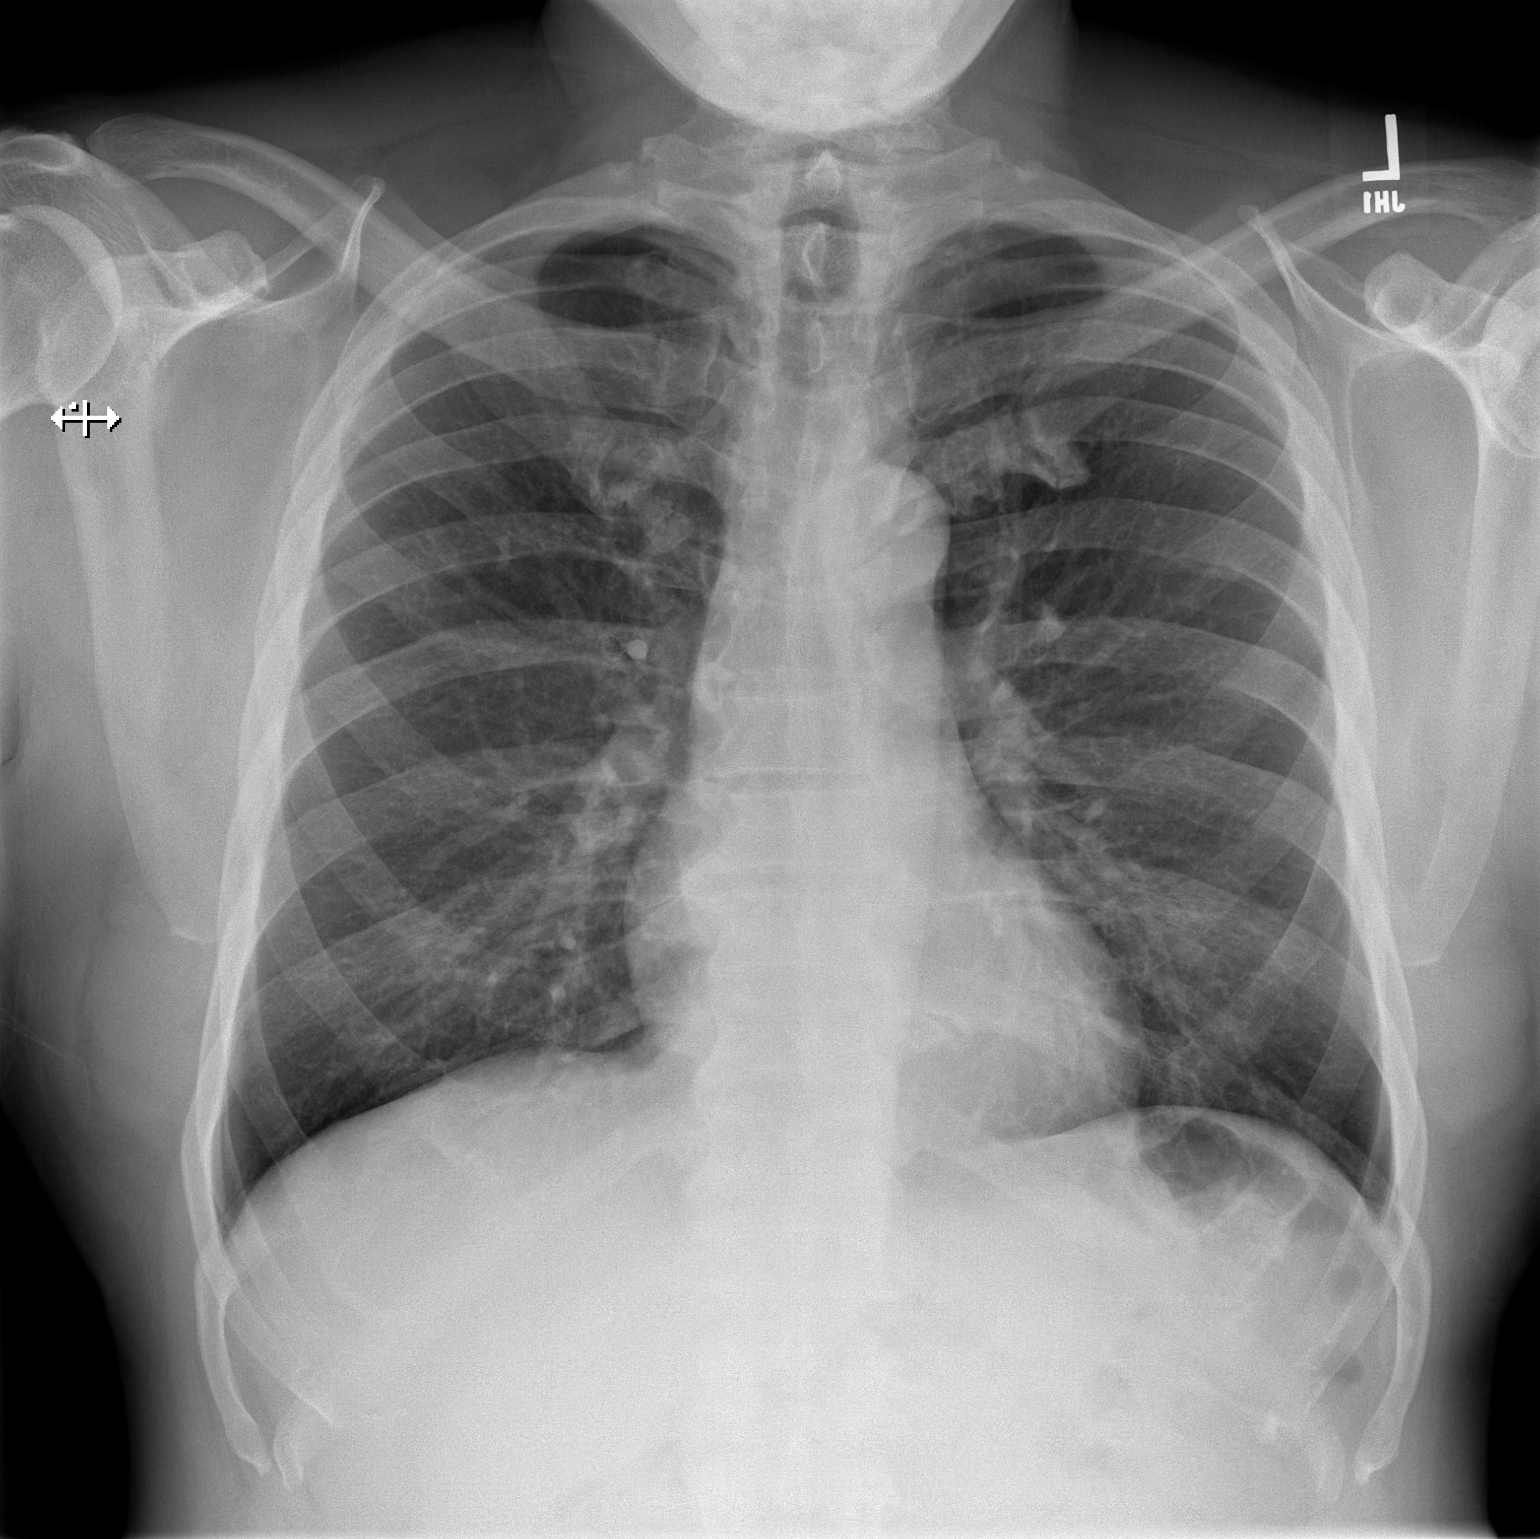

[w chest lat]
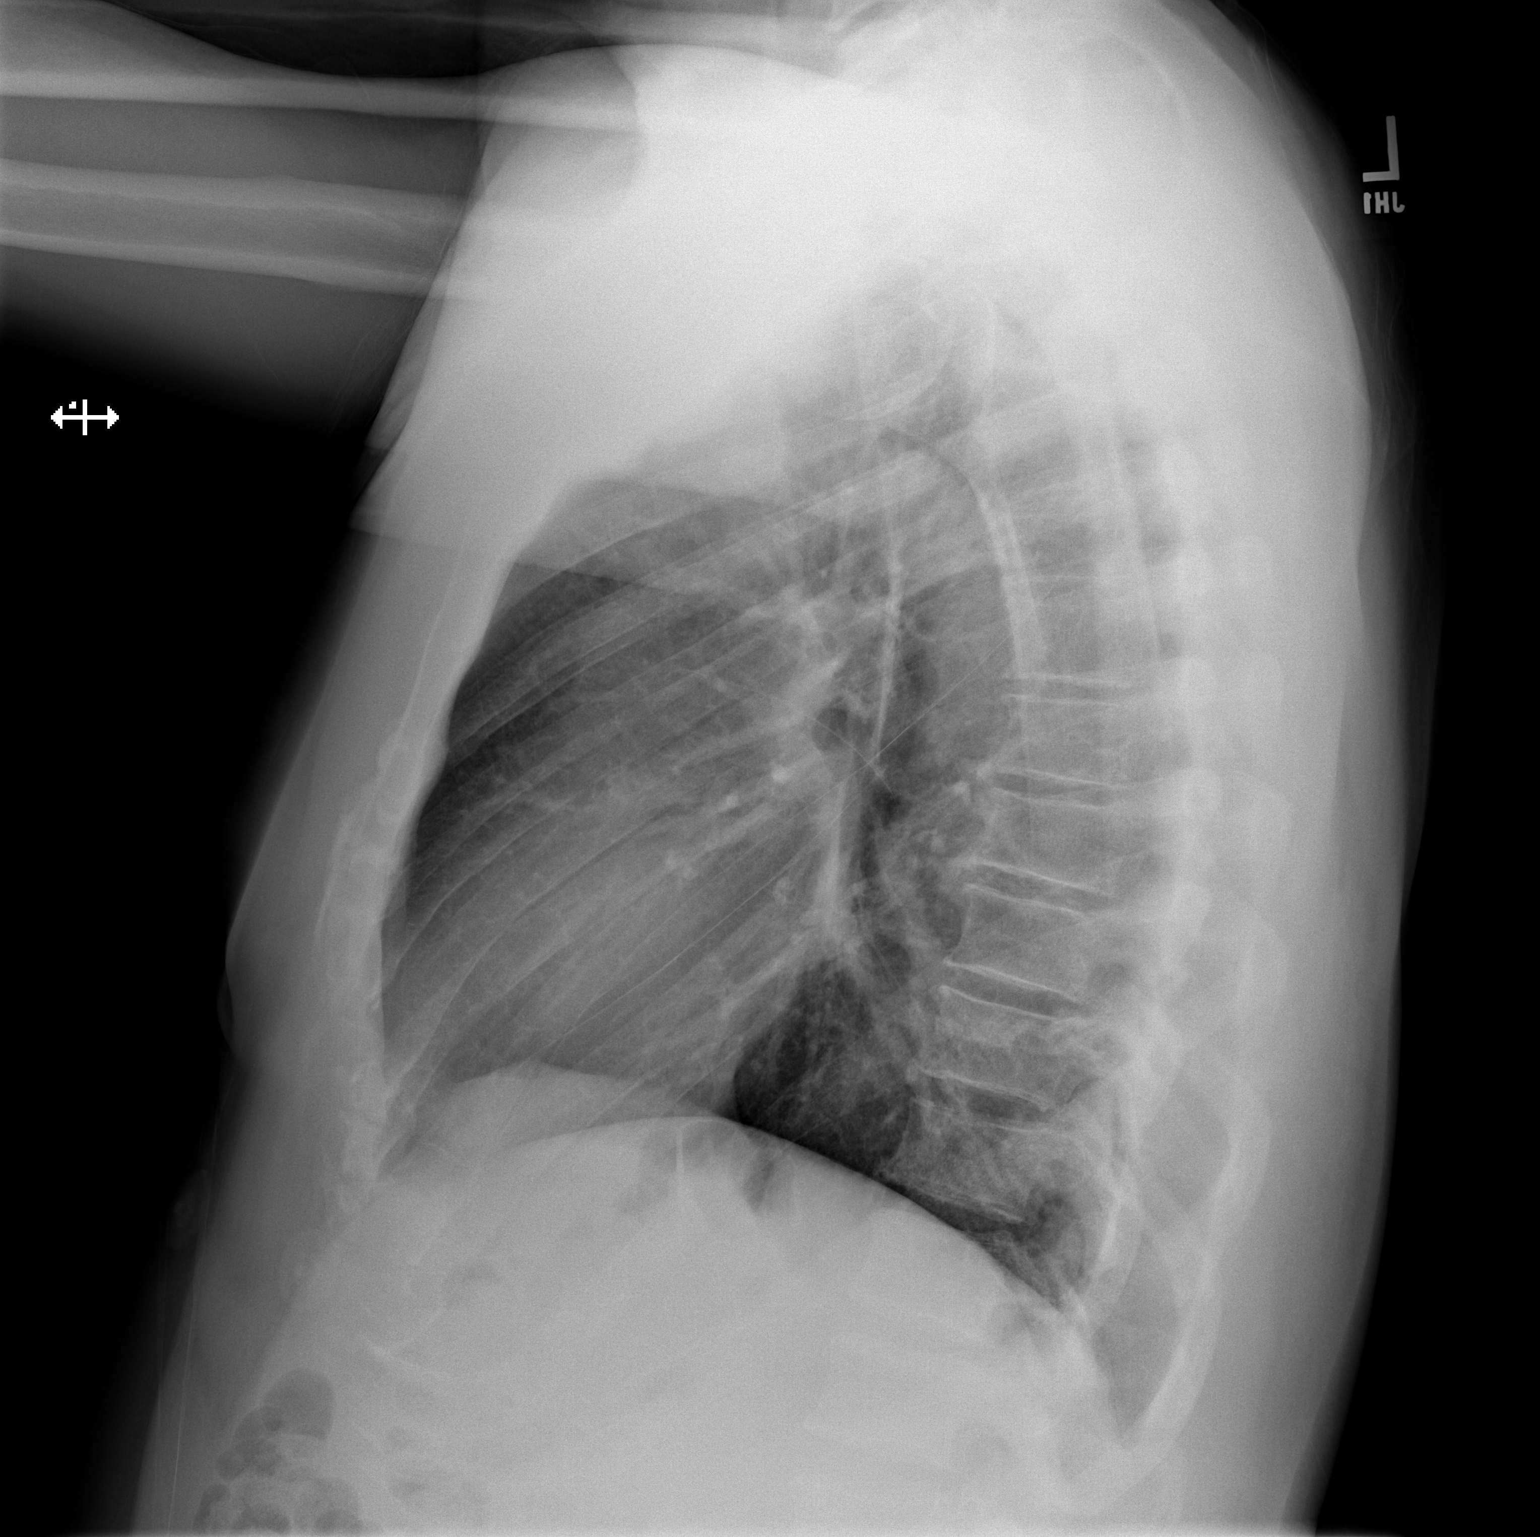

[2 of 2 positions shown; findings below may reference images not displayed]

FINDINGS: Cardiomediastinal contours and hilar structures are normal.

Lungs are clear. No sign of pleural effusion. On limited assessment
skeletal structures are unremarkable.
IMPRESSION: No acute cardiopulmonary disease.
